# Patient Record
Sex: Female | Born: 1945 | Race: White | Hispanic: No | Marital: Married | Smoking: Never smoker
Health system: Southern US, Community
[De-identification: ages and names within clinical notes are randomized; demographics above are authoritative.]

---

## 1998-05-25 ENCOUNTER — Other Ambulatory Visit: Admission: RE | Admit: 1998-05-25 | Discharge: 1998-05-25 | Payer: Self-pay | Admitting: Obstetrics and Gynecology

## 1998-09-26 ENCOUNTER — Other Ambulatory Visit: Admission: RE | Admit: 1998-09-26 | Discharge: 1998-09-26 | Payer: Self-pay | Admitting: Obstetrics and Gynecology

## 2000-02-26 ENCOUNTER — Encounter: Payer: Self-pay | Admitting: Obstetrics and Gynecology

## 2000-02-26 ENCOUNTER — Encounter: Admission: RE | Admit: 2000-02-26 | Discharge: 2000-02-26 | Payer: Self-pay | Admitting: Obstetrics and Gynecology

## 2000-04-10 ENCOUNTER — Other Ambulatory Visit: Admission: RE | Admit: 2000-04-10 | Discharge: 2000-04-10 | Payer: Self-pay | Admitting: *Deleted

## 2001-05-08 ENCOUNTER — Encounter: Admission: RE | Admit: 2001-05-08 | Discharge: 2001-05-08 | Payer: Self-pay | Admitting: *Deleted

## 2001-06-30 ENCOUNTER — Other Ambulatory Visit: Admission: RE | Admit: 2001-06-30 | Discharge: 2001-06-30 | Payer: Self-pay | Admitting: *Deleted

## 2002-10-13 ENCOUNTER — Encounter: Admission: RE | Admit: 2002-10-13 | Discharge: 2002-10-13 | Payer: Self-pay | Admitting: *Deleted

## 2003-04-01 ENCOUNTER — Ambulatory Visit (HOSPITAL_COMMUNITY): Admission: RE | Admit: 2003-04-01 | Discharge: 2003-04-01 | Payer: Self-pay | Admitting: Internal Medicine

## 2003-05-05 ENCOUNTER — Other Ambulatory Visit: Admission: RE | Admit: 2003-05-05 | Discharge: 2003-05-05 | Payer: Self-pay | Admitting: *Deleted

## 2003-06-01 ENCOUNTER — Encounter: Admission: RE | Admit: 2003-06-01 | Discharge: 2003-06-01 | Payer: Self-pay | Admitting: *Deleted

## 2004-04-02 ENCOUNTER — Encounter: Admission: RE | Admit: 2004-04-02 | Discharge: 2004-04-02 | Payer: Self-pay | Admitting: *Deleted

## 2005-06-24 ENCOUNTER — Encounter: Admission: RE | Admit: 2005-06-24 | Discharge: 2005-06-24 | Payer: Self-pay | Admitting: *Deleted

## 2006-08-08 ENCOUNTER — Encounter: Admission: RE | Admit: 2006-08-08 | Discharge: 2006-08-08 | Payer: Self-pay | Admitting: *Deleted

## 2007-09-15 ENCOUNTER — Ambulatory Visit (HOSPITAL_COMMUNITY): Admission: RE | Admit: 2007-09-15 | Discharge: 2007-09-15 | Payer: Self-pay | Admitting: Obstetrics and Gynecology

## 2008-10-13 ENCOUNTER — Ambulatory Visit (HOSPITAL_COMMUNITY): Admission: RE | Admit: 2008-10-13 | Discharge: 2008-10-13 | Payer: Self-pay | Admitting: Obstetrics and Gynecology

## 2008-12-20 ENCOUNTER — Ambulatory Visit (HOSPITAL_COMMUNITY): Admission: RE | Admit: 2008-12-20 | Discharge: 2008-12-20 | Payer: Self-pay | Admitting: Obstetrics and Gynecology

## 2010-08-02 ENCOUNTER — Ambulatory Visit (HOSPITAL_COMMUNITY): Admission: RE | Admit: 2010-08-02 | Discharge: 2010-08-02 | Payer: Self-pay | Admitting: Obstetrics and Gynecology

## 2010-08-10 ENCOUNTER — Encounter: Admission: RE | Admit: 2010-08-10 | Discharge: 2010-08-10 | Payer: Self-pay | Admitting: Obstetrics and Gynecology

## 2011-04-19 NOTE — Op Note (Signed)
NAME:  Alyssa Weber, Alyssa Weber                           ACCOUNT NO.:  [id number]   MEDICAL RECORD NO.:  [id number]                   PATIENT TYPE:  AMB   LOCATION:  DAY                                  FACILITY:  [facility]   PHYSICIAN:  Lionel December, M.D.                 DATE OF BIRTH:  28-Jun-1946   DATE OF PROCEDURE:  04/01/2003  DATE OF DISCHARGE:                                  PROCEDURE NOTE   PROCEDURE:  Total colonoscopy.   INDICATIONS:  Perris is a 65 year old Caucasian female who has symptomatic  hematochezia felt to be secondary to hemorrhoids.  She is undergoing  colonoscopy to make sure she does not have lesions other than hemorrhoids.  Procedure was reviewed with the patient and informed consent was obtained.   PREOPERATIVE MEDICATIONS:  Demerol 20 mg IV, Versed 3 mg IV in divided  doses, atropine 0.3 mg IV.   FINDINGS:  The procedure performed in endoscopy suite.  The patient's vital  signs and O2 sat were monitored during the procedure and remained stable.  The patient was placed in the left lateral recumbent position.  Rectal  examination was performed.  No abnormalities noted on external or digital  exam.  The scope was placed in the rectum and advanced under vision into the  sigmoid colon and beyond.  Scattered diverticula were noted in the sigmoid,  descending, as well as transverse, and a few more in the ascending colon.  Scope was passed into the cecum which was identified by the appendiceal  orifice and ileocecal valve.  Short segment of terminal ileum was also  examined and was normal.  As the scope was withdrawn, colonic mucosa was  once again carefully examined.  No polyps and/or tumor masses were noted.  The rectal mucosa was normal.  Scope was retroflexed to examine the  anorectal junction and small hemorrhoids were noted below the dentate line  along with a prominent papilla  The scope was straightened and withdrawn.  The patient tolerated the procedure well.   FINAL DIAGNOSES:  1. Pancolonic diverticulosis.  2. Small external hemorrhoids.  Her hematochezia is felt to be secondary to     hemorrhoids.  3. No evidence of neoplasm or polyps.   RECOMMENDATIONS:  1. High fiber diet.  2.     Citrucel one tablespoonful daily.  3. She should continue yearly hemoccults and consider having next screen in     10 years unless she had any symptoms pertaining to lower GI tract.                                               Lionel December, M.D.    NR/MEDQ  D:  04/01/2003  T:  04/01/2003  Job:  [id number]   cc:  Kingsley Callander. Ouida Sills, M.D.  [address]  [place]  [place] [address]  Fax: [phone]   Pershing Cox, M.D.  [address]  [address]  [place]  [place] [address]  Fax: [phone]

## 2013-02-18 ENCOUNTER — Other Ambulatory Visit: Payer: Self-pay

## 2013-03-02 ENCOUNTER — Other Ambulatory Visit (HOSPITAL_COMMUNITY): Payer: Self-pay | Admitting: Obstetrics and Gynecology

## 2013-03-02 DIAGNOSIS — M8580 Other specified disorders of bone density and structure, unspecified site: Secondary | ICD-10-CM

## 2013-03-02 DIAGNOSIS — Z780 Asymptomatic menopausal state: Secondary | ICD-10-CM

## 2013-03-29 ENCOUNTER — Ambulatory Visit
Admission: RE | Admit: 2013-03-29 | Discharge: 2013-03-29 | Disposition: A | Discharge status: Home / Self Care | Payer: Medicare Other | Source: Ambulatory Visit

## 2013-03-29 DIAGNOSIS — Z1231 Encounter for screening mammogram for malignant neoplasm of breast: Secondary | ICD-10-CM

## 2015-02-10 ENCOUNTER — Other Ambulatory Visit: Payer: Self-pay

## 2015-02-10 DIAGNOSIS — Z1231 Encounter for screening mammogram for malignant neoplasm of breast: Secondary | ICD-10-CM

## 2015-03-03 ENCOUNTER — Ambulatory Visit
Admission: RE | Admit: 2015-03-03 | Discharge: 2015-03-03 | Disposition: A | Discharge status: Home / Self Care | Payer: Medicare Other | Source: Ambulatory Visit

## 2015-03-03 DIAGNOSIS — Z1231 Encounter for screening mammogram for malignant neoplasm of breast: Secondary | ICD-10-CM

## 2017-01-15 ENCOUNTER — Other Ambulatory Visit: Payer: Self-pay | Admitting: Internal Medicine

## 2017-01-15 DIAGNOSIS — Z1231 Encounter for screening mammogram for malignant neoplasm of breast: Secondary | ICD-10-CM

## 2017-01-28 ENCOUNTER — Ambulatory Visit
Admission: RE | Admit: 2017-01-28 | Discharge: 2017-01-28 | Disposition: A | Discharge status: Home / Self Care | Payer: Medicare Other | Source: Ambulatory Visit | Attending: Internal Medicine | Admitting: Internal Medicine

## 2017-01-28 DIAGNOSIS — Z1231 Encounter for screening mammogram for malignant neoplasm of breast: Secondary | ICD-10-CM

## 2018-05-08 ENCOUNTER — Other Ambulatory Visit: Payer: Self-pay | Admitting: Internal Medicine

## 2018-05-08 DIAGNOSIS — Z1231 Encounter for screening mammogram for malignant neoplasm of breast: Secondary | ICD-10-CM

## 2018-05-28 ENCOUNTER — Ambulatory Visit
Admission: RE | Admit: 2018-05-28 | Discharge: 2018-05-28 | Disposition: A | Discharge status: Home / Self Care | Payer: Medicare Other | Source: Ambulatory Visit | Attending: Internal Medicine | Admitting: Internal Medicine

## 2018-05-28 DIAGNOSIS — Z1231 Encounter for screening mammogram for malignant neoplasm of breast: Secondary | ICD-10-CM

## 2020-04-25 ENCOUNTER — Other Ambulatory Visit: Payer: Self-pay | Admitting: Internal Medicine

## 2020-04-25 DIAGNOSIS — Z1231 Encounter for screening mammogram for malignant neoplasm of breast: Secondary | ICD-10-CM

## 2020-04-27 ENCOUNTER — Other Ambulatory Visit: Payer: Self-pay

## 2020-04-27 ENCOUNTER — Ambulatory Visit
Admission: RE | Admit: 2020-04-27 | Discharge: 2020-04-27 | Disposition: A | Discharge status: Home / Self Care | Payer: Medicare Other | Source: Ambulatory Visit | Attending: Internal Medicine | Admitting: Internal Medicine

## 2020-04-27 DIAGNOSIS — Z1231 Encounter for screening mammogram for malignant neoplasm of breast: Secondary | ICD-10-CM

## 2020-10-02 ENCOUNTER — Other Ambulatory Visit (HOSPITAL_COMMUNITY): Payer: Self-pay | Admitting: Internal Medicine

## 2020-10-02 ENCOUNTER — Ambulatory Visit (HOSPITAL_COMMUNITY)
Admission: RE | Admit: 2020-10-02 | Discharge: 2020-10-02 | Disposition: A | Discharge status: Home / Self Care | Payer: Medicare PPO | Source: Ambulatory Visit | Attending: Internal Medicine | Admitting: Internal Medicine

## 2020-10-02 ENCOUNTER — Telehealth: Payer: Self-pay | Admitting: Orthopedic Surgery

## 2020-10-02 ENCOUNTER — Encounter: Payer: Self-pay | Admitting: Orthopedic Surgery

## 2020-10-02 ENCOUNTER — Other Ambulatory Visit: Payer: Self-pay

## 2020-10-02 ENCOUNTER — Ambulatory Visit (INDEPENDENT_AMBULATORY_CARE_PROVIDER_SITE_OTHER): Payer: Medicare PPO | Admitting: Orthopedic Surgery

## 2020-10-02 VITALS — BP 147/84 | HR 84 | Ht 60.0 in | Wt 106.0 lb

## 2020-10-02 DIAGNOSIS — M25522 Pain in left elbow: Secondary | ICD-10-CM | POA: Diagnosis not present

## 2020-10-02 DIAGNOSIS — S52125A Nondisplaced fracture of head of left radius, initial encounter for closed fracture: Secondary | ICD-10-CM

## 2020-10-02 DIAGNOSIS — S52122A Displaced fracture of head of left radius, initial encounter for closed fracture: Secondary | ICD-10-CM | POA: Diagnosis not present

## 2020-10-02 DIAGNOSIS — W19XXXA Unspecified fall, initial encounter: Secondary | ICD-10-CM | POA: Diagnosis not present

## 2020-10-02 DIAGNOSIS — Z23 Encounter for immunization: Secondary | ICD-10-CM | POA: Diagnosis not present

## 2020-10-02 DIAGNOSIS — S52124A Nondisplaced fracture of head of right radius, initial encounter for closed fracture: Secondary | ICD-10-CM

## 2020-10-02 NOTE — Patient Instructions (Signed)
Radial Fracture  A radial fracture is a break in the radius bone. The radius is a bone in the forearm, on the same side as the thumb. The forearm is the part of the arm that is between the elbow and the wrist. A radial fracture near the wrist (distal radialfracture) is the most common type of broken arm. A fracture can also occur near the elbow (radial head fracture). What are the causes? The most common cause of a radial fracture is falling with the arm outstretched. Other causes include:  An accident, such as a car or bike accident.  A hard, direct hit to the forearm. What increases the risk? You may be at greater risk for a radial fracture if you:  Are female.  Are an older adult.  Play contact sports.  Have a condition that causes your bones to become thin and brittle (osteoporosis). What are the signs or symptoms? A radial fracture causes pain immediately after the injury. Other signs and symptoms may include:  An abnormal bend or bump in the arm (deformity).  Swelling.  Bruising.  Numbness or tingling in your arm and hand.  Limited movement of your arm and hand. How is this diagnosed? This condition may be diagnosed based on:  Your symptoms and medical history.  A physical exam.  An X-ray. How is this treated? Treatment depends on how severe your fracture is, where it is, and how the pieces of the broken bone line up with each other (alignment).  The first step may be for you to wear a temporary splint for a few days, until your swelling goes down. After the swelling goes down, you may get a cast, get a different type of splint, or have surgery.  If your broken bone is in good alignment, you will need to wear a splint or cast for up to 6 weeks.  If your broken bone is not aligned (is displaced), your health care provider will need to align the bone pieces. After alignment, you will need to wear a splint or cast for up to 6 weeks. To align your broken bone, your  health care provider may: ? Move the bones back into position without surgery (closed reduction). ? Perform surgery to align the fracture and fix the bone pieces into place with metal screws, plates, or wires (open reduction and internal fixation, ORIF). ? Perform surgery to align the fracture and fix the bone pieces into place with pins that are attached to a stabilizing bar outside your skin (external fixation). Treatment may also include:  Having your cast changed after 2-3 weeks.  Physical therapy.  Follow-up visits and X-rays to make sure you are healing. Follow these instructions at home: If you have a splint:  Wear it as told by your health care provider. Remove it only as told by your health care provider.  Loosen the splint if your fingers tingle, become numb, or turn cold and blue.  Keep the splint clean and dry. If you have a cast:  Do not stick anything inside the cast to scratch your skin. Doing that increases your risk for infection.  Check the skin around the cast every day. Tell your health care provider about any concerns.  You may put lotion on dry skin around the edges of the cast. Do not put lotion on the skin underneath the cast.  Keep the cast clean and dry. Bathing  Do not take baths, swim, or use a hot tub until your health care   provider approves. Ask your health care provider if you may take showers. You may only be allowed to take sponge baths.  If your splint or cast is not waterproof: ? Do not let it get wet. ? Cover it with a watertight covering when you take a bath or a shower. Activity  Do not lift anything with your injured arm.  Do not use the injured arm to support your body weight until your health care provider says that you can.  Ask your health care provider what activities are safe for you during recovery, and ask what activities you need to avoid. Managing pain, stiffness, and swelling   If directed, put ice on painful areas: ? If  you have a removable splint, remove it as told by your health care provider. ? Put ice in a plastic bag. ? Place a towel between your skin and the bag, or between your cast and the bag. ? Leave the ice on for 20 minutes, 2-3 times a day.  Move your fingers often to avoid stiffness and to lessen swelling.  Raise (elevate) your arm above the level of your heart while you are sitting or lying down. General instructions  Do not put pressure on any part of the cast or splint until it is fully hardened, if applicable. This may take several hours.  Take over-the-counter and prescription medicines only as told by your health care provider.  Do not drive until your health care provider approves. You should not drive or use heavy machinery while taking prescription pain medicine.  Do not use any products that contain nicotine or tobacco, such as cigarettes and e-cigarettes. These can delay bone healing. If you need help quitting, ask your health care provider.  Keep all follow-up visits as told by your health care provider. This is important. Contact a health care provider if you have:  Pain that does not get better with medicine.  Swelling that gets worse.  A bad smell coming from your cast. Get help right away if:  You cannot move your fingers.  You have severe pain.  Your fingers or your hand: ? Become numb, cold, or pale. ? Turn a bluish color. Summary  A radial fracture is a break in the radius bone. The radius is in the forearm, on the same side as the thumb.  Treatment depends on how severe your fracture is, where it is, and how the pieces of the broken bone line up with each other.  A splint or cast may be needed to help the fracture heal. A more severe break may require surgery. This information is not intended to replace advice given to you by your health care provider. Make sure you discuss any questions you have with your health care provider. Document Revised: 11/12/2017  Document Reviewed: 11/12/2017 [employer] Patient Education  2020 [employer]  

## 2020-10-02 NOTE — Telephone Encounter (Signed)
Dr Ouida Sills would like to speak with Dr Romeo Apple 1:38pm, 10/02/20 regarding patient who was sent for elbow Xray - wish to speak with Dr Romeo Apple - per Dr Romeo Apple - speak with nurse.

## 2020-10-02 NOTE — Progress Notes (Signed)
Chief Complaint  Patient presents with  . Elbow Injury    10/01/20 fall while running has left elbow pain     74 year old female complains of left elbow pain x1 day secondary to fall.  Pain was severe enough to get her to the primary care doctor's office who sent her for x-ray which was positive and then she comes here for evaluation and treatment complains of pain swelling decreased range of motion in the left elbow  Review of systems  No past medical history on file.  No past surgical history on file.  Ht 5' (1.524 m)   Wt 106 lb (48.1 kg)   LMP  (LMP Unknown)   BMI 20.70 kg/m   She is awake alert and oriented x3 mood and affect are normal  Normal sensation in the right and left upper extremity skin is normal.  Right elbow tender laterally decreased range of motion stability tests were deferred because of pain muscle strength and tone are normal distally  No gait abnormality  Good pulse and perfusion is noted and her appearance was normal  Outside x-rays show a nondisplaced slightly depressed radial head fracture with intact elbow joint  1 week sling Start immediate active range of motion

## 2020-10-16 ENCOUNTER — Other Ambulatory Visit: Payer: Self-pay

## 2020-10-16 ENCOUNTER — Ambulatory Visit (INDEPENDENT_AMBULATORY_CARE_PROVIDER_SITE_OTHER): Payer: Medicare PPO | Admitting: Orthopedic Surgery

## 2020-10-16 VITALS — Ht 61.0 in | Wt 106.0 lb

## 2020-10-16 DIAGNOSIS — S52124D Nondisplaced fracture of head of right radius, subsequent encounter for closed fracture with routine healing: Secondary | ICD-10-CM

## 2020-10-16 NOTE — Progress Notes (Signed)
Chief Complaint  Patient presents with  . Follow-up    Recheck on left elbow, DOI 10-01-20.   Encounter Diagnosis  Name Primary?  . Closed nondisplaced fracture of head of right radius with routine healing, subsequent encounter Yes    15 days ago injured   Minimal pain and discomfort  Her range of motion is excellent except for the last few degrees of extension and some discomfort with pronation supination  X-rays down today so we will take her x-ray on the 29th

## 2020-10-18 ENCOUNTER — Ambulatory Visit: Payer: Medicare PPO | Admitting: Orthopedic Surgery

## 2020-10-25 DIAGNOSIS — S52126D Nondisplaced fracture of head of unspecified radius, subsequent encounter for closed fracture with routine healing: Secondary | ICD-10-CM | POA: Insufficient documentation

## 2020-10-30 ENCOUNTER — Ambulatory Visit (INDEPENDENT_AMBULATORY_CARE_PROVIDER_SITE_OTHER): Payer: Medicare PPO | Admitting: Orthopedic Surgery

## 2020-10-30 ENCOUNTER — Other Ambulatory Visit: Payer: Self-pay

## 2020-10-30 ENCOUNTER — Ambulatory Visit: Payer: Medicare PPO

## 2020-10-30 ENCOUNTER — Encounter: Payer: Self-pay | Admitting: Orthopedic Surgery

## 2020-10-30 VITALS — Ht 61.0 in | Wt 106.0 lb

## 2020-10-30 DIAGNOSIS — S52124D Nondisplaced fracture of head of right radius, subsequent encounter for closed fracture with routine healing: Secondary | ICD-10-CM

## 2020-10-30 DIAGNOSIS — S52125D Nondisplaced fracture of head of left radius, subsequent encounter for closed fracture with routine healing: Secondary | ICD-10-CM

## 2020-10-30 NOTE — Progress Notes (Signed)
Chief Complaint  Patient presents with  . Elbow Injury    LEFT elbow radial head fracture     74 year old female fracture left radial head doing well no complaints of pain she is regained full range of motion x-ray shows fracture healing patient can be released

## 2021-07-04 ENCOUNTER — Other Ambulatory Visit: Payer: Self-pay | Admitting: Internal Medicine

## 2021-07-04 DIAGNOSIS — Z1231 Encounter for screening mammogram for malignant neoplasm of breast: Secondary | ICD-10-CM

## 2021-07-09 DIAGNOSIS — R1032 Left lower quadrant pain: Secondary | ICD-10-CM | POA: Diagnosis not present

## 2021-07-25 ENCOUNTER — Ambulatory Visit
Admission: RE | Admit: 2021-07-25 | Discharge: 2021-07-25 | Disposition: A | Discharge status: Home / Self Care | Payer: Medicare PPO | Source: Ambulatory Visit | Attending: Internal Medicine | Admitting: Internal Medicine

## 2021-07-25 ENCOUNTER — Other Ambulatory Visit: Payer: Self-pay

## 2021-07-25 DIAGNOSIS — Z1231 Encounter for screening mammogram for malignant neoplasm of breast: Secondary | ICD-10-CM

## 2021-10-11 DIAGNOSIS — Z23 Encounter for immunization: Secondary | ICD-10-CM | POA: Diagnosis not present

## 2021-12-30 DIAGNOSIS — I10 Essential (primary) hypertension: Secondary | ICD-10-CM | POA: Diagnosis not present

## 2021-12-30 DIAGNOSIS — E785 Hyperlipidemia, unspecified: Secondary | ICD-10-CM | POA: Diagnosis not present

## 2022-08-29 ENCOUNTER — Other Ambulatory Visit: Payer: Self-pay | Admitting: Internal Medicine

## 2022-08-29 DIAGNOSIS — Z1231 Encounter for screening mammogram for malignant neoplasm of breast: Secondary | ICD-10-CM

## 2022-08-29 DIAGNOSIS — Z23 Encounter for immunization: Secondary | ICD-10-CM | POA: Diagnosis not present

## 2022-10-01 ENCOUNTER — Ambulatory Visit
Admission: RE | Admit: 2022-10-01 | Discharge: 2022-10-01 | Disposition: A | Discharge status: Home / Self Care | Payer: Medicare PPO | Source: Ambulatory Visit | Attending: Internal Medicine | Admitting: Internal Medicine

## 2022-10-01 DIAGNOSIS — Z1231 Encounter for screening mammogram for malignant neoplasm of breast: Secondary | ICD-10-CM

## 2022-10-09 IMAGING — MG MM DIGITAL SCREENING BILAT W/ TOMO AND CAD
8 series · 9 of 24 positions shown · non-contrast
Comparison: Previous exam(s).

CLINICAL DATA: Screening.

EXAM:
DIGITAL SCREENING BILATERAL MAMMOGRAM WITH TOMOSYNTHESIS AND CAD
TECHNIQUE: Bilateral screening digital craniocaudal and mediolateral oblique
mammograms were obtained. Bilateral screening digital breast
tomosynthesis was performed. The images were evaluated with
computer-aided detection.

[R MLO synth-2D]
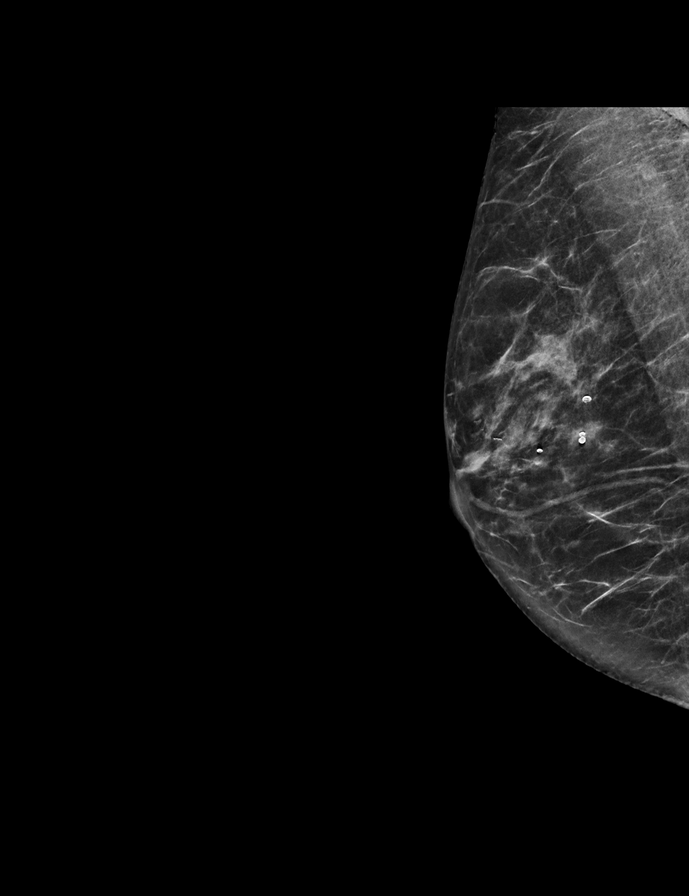

[L CC synth-2D]
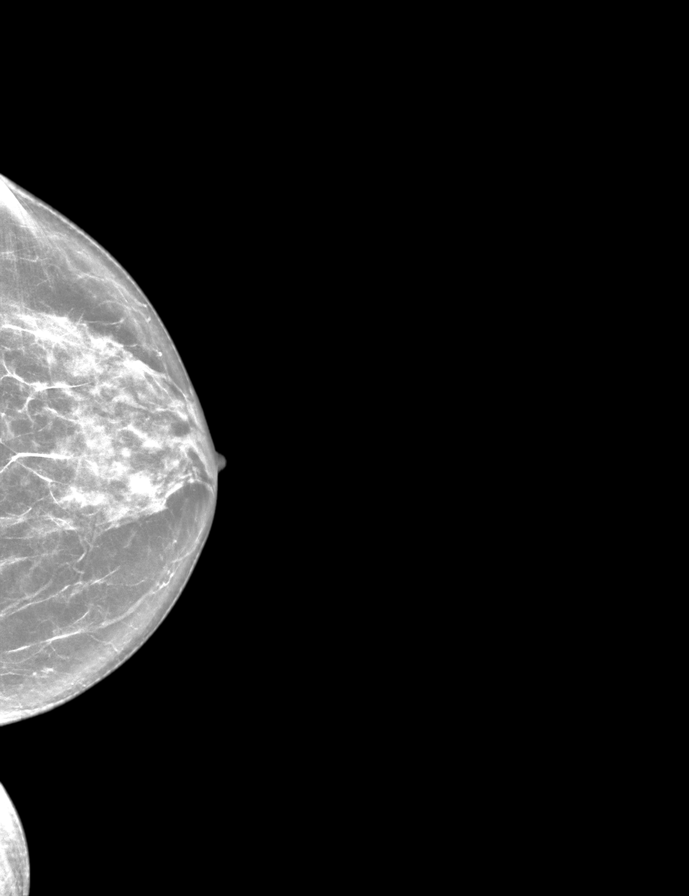

[L MLO synth-2D]
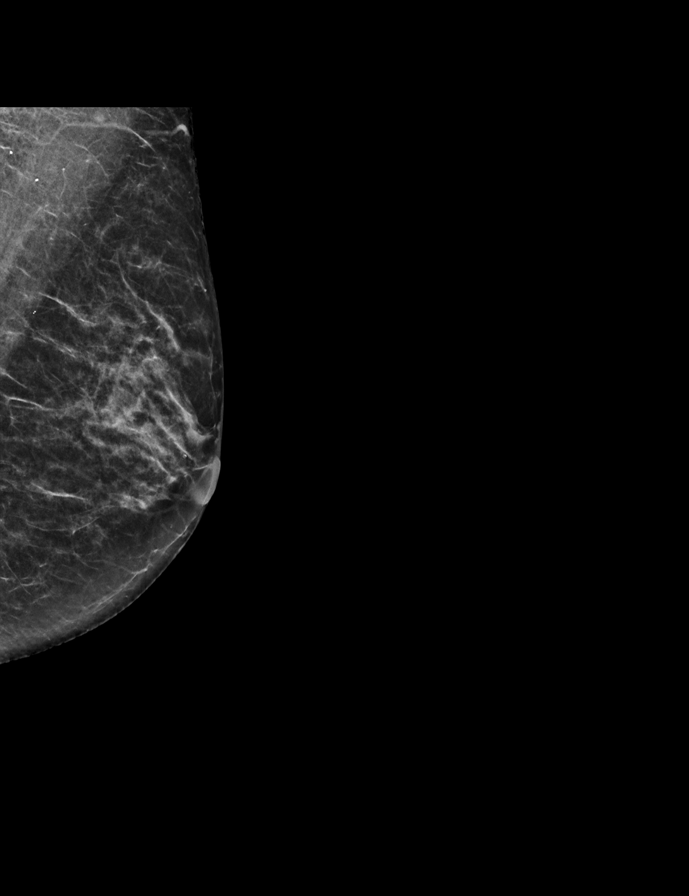

[R CC synth-2D]
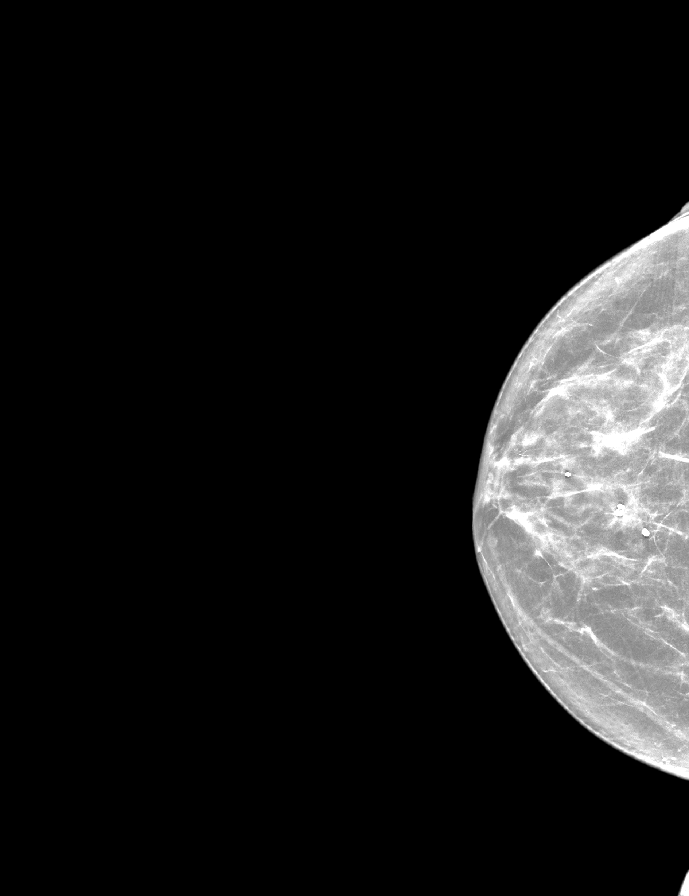

[R CC tomo · 2 of 59 frames shown]
[frame 20/59]
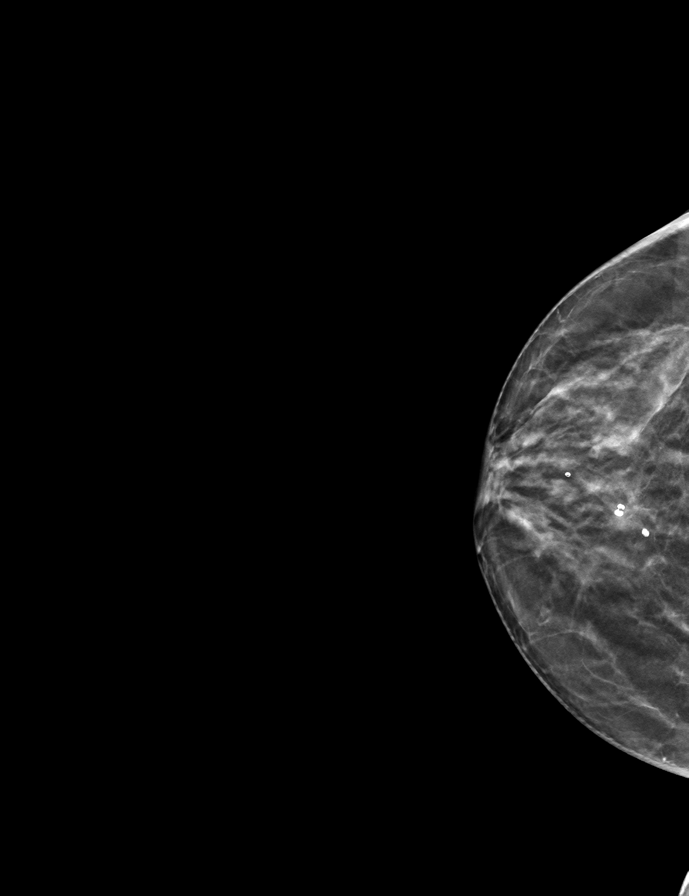
[frame 30/59]
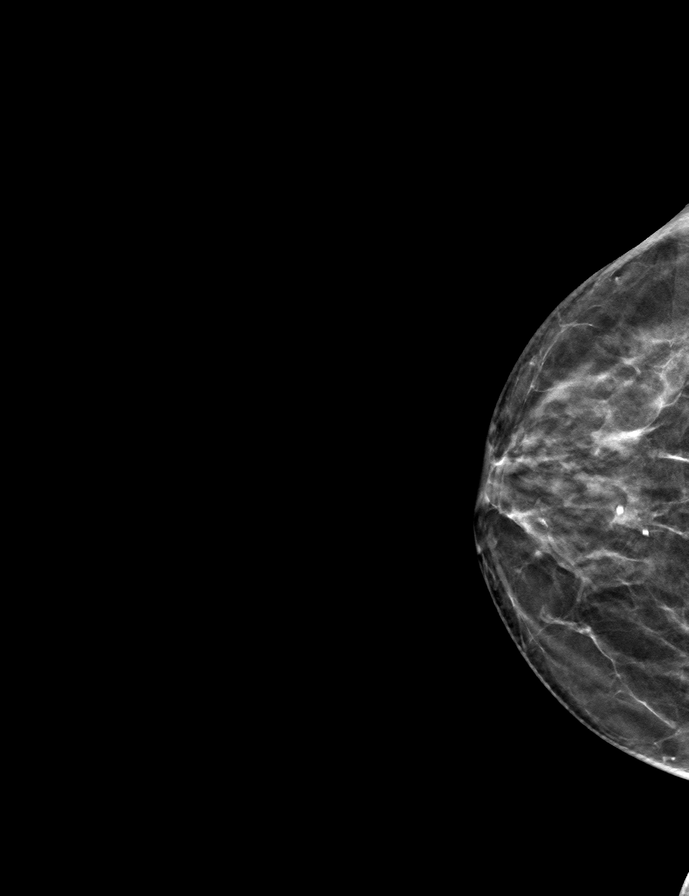

[L MLO tomo · tomo slice 27/54.0]
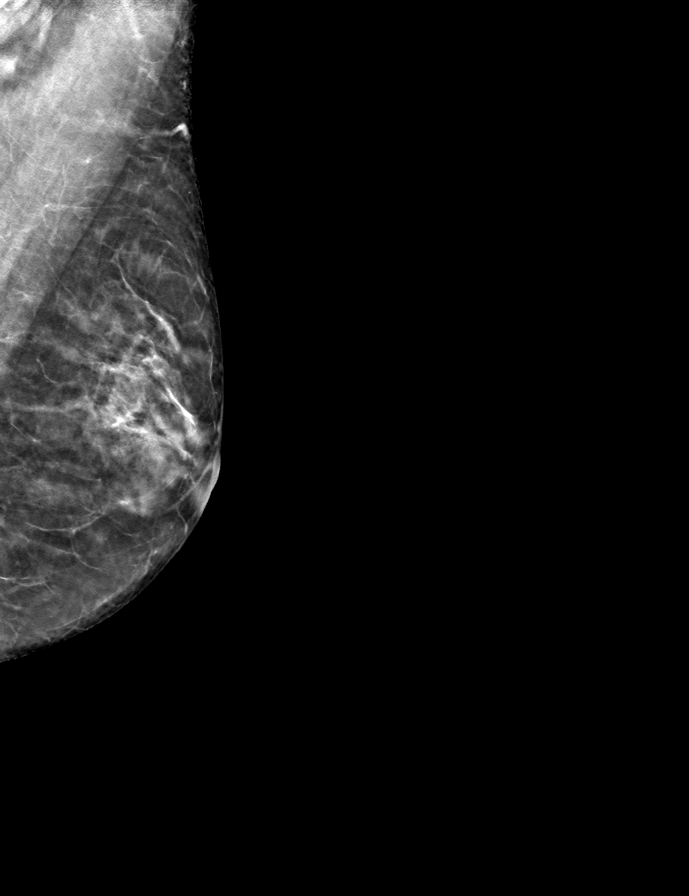

[R MLO tomo · tomo slice 27/54.0]
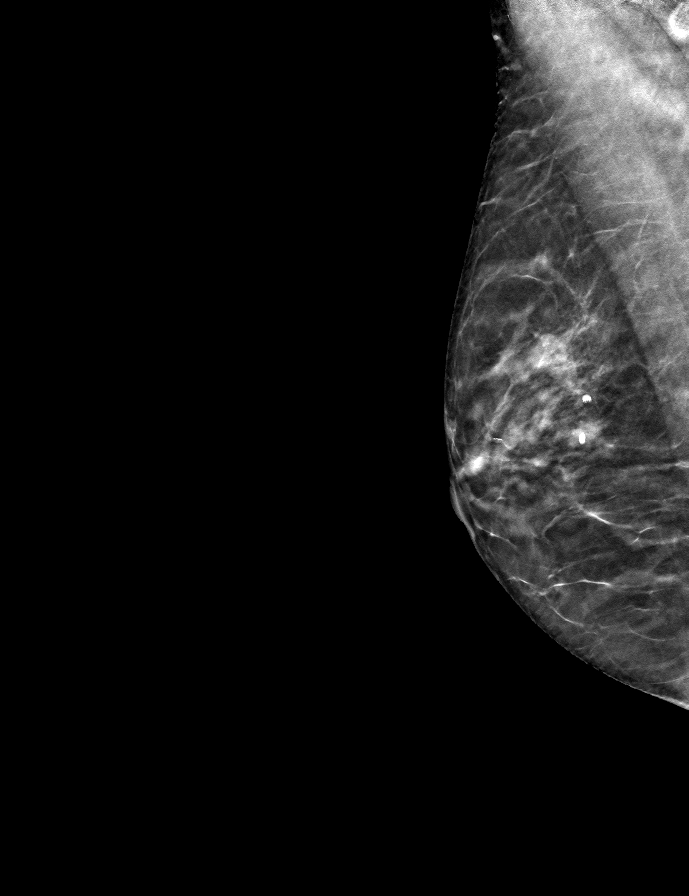

[L CC tomo · tomo slice 29/57.0]
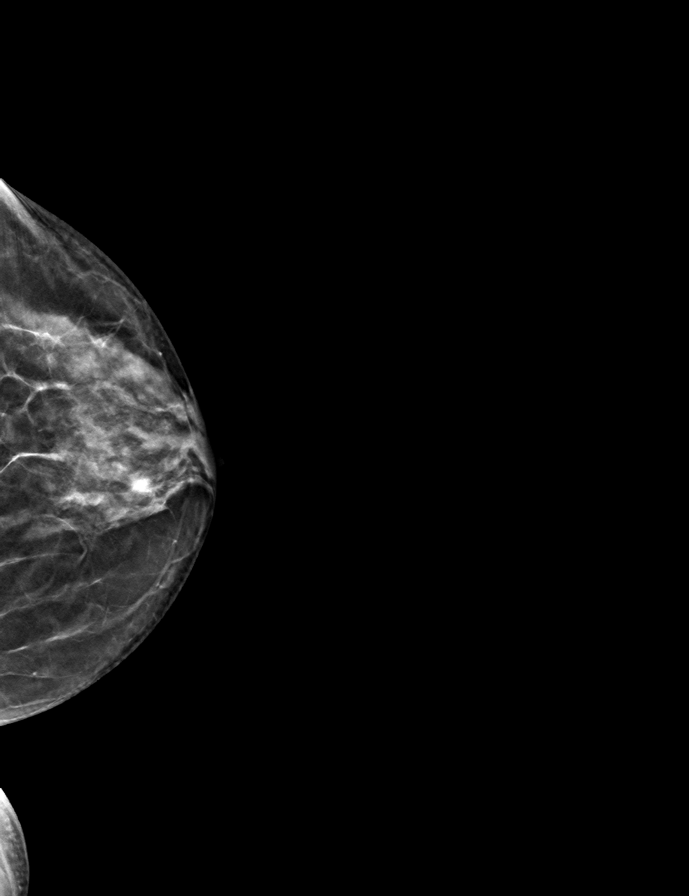

[9 of 24 positions shown; findings below may reference images not displayed]

ACR Breast Density Category c: The breast tissue is heterogeneously
dense, which may obscure small masses.
FINDINGS: There are no findings suspicious for malignancy.
IMPRESSION: No mammographic evidence of malignancy. A result letter of this
screening mammogram will be mailed directly to the patient.

RECOMMENDATION:
Screening mammogram in one year. (Code:Q3-W-BC3)

BI-RADS CATEGORY  1: Negative.

## 2023-10-07 ENCOUNTER — Other Ambulatory Visit: Payer: Self-pay | Admitting: Internal Medicine

## 2023-10-07 DIAGNOSIS — Z1231 Encounter for screening mammogram for malignant neoplasm of breast: Secondary | ICD-10-CM

## 2023-10-09 ENCOUNTER — Ambulatory Visit: Payer: Medicare PPO

## 2023-11-05 DIAGNOSIS — Z23 Encounter for immunization: Secondary | ICD-10-CM | POA: Diagnosis not present

## 2023-11-12 DIAGNOSIS — Z809 Family history of malignant neoplasm, unspecified: Secondary | ICD-10-CM | POA: Diagnosis not present

## 2023-11-12 DIAGNOSIS — I10 Essential (primary) hypertension: Secondary | ICD-10-CM | POA: Diagnosis not present

## 2023-11-12 DIAGNOSIS — Z8249 Family history of ischemic heart disease and other diseases of the circulatory system: Secondary | ICD-10-CM | POA: Diagnosis not present

## 2024-10-12 DIAGNOSIS — Z23 Encounter for immunization: Secondary | ICD-10-CM | POA: Diagnosis not present
